# Patient Record
Sex: Male | Born: 1995
Health system: Southern US, Community
[De-identification: ages and names within clinical notes are randomized; demographics above are authoritative.]

## PROBLEM LIST (undated history)

## (undated) DIAGNOSIS — T7840XA Allergy, unspecified, initial encounter: Secondary | ICD-10-CM

## (undated) DIAGNOSIS — R51 Headache: Secondary | ICD-10-CM

## (undated) DIAGNOSIS — F913 Oppositional defiant disorder: Secondary | ICD-10-CM

## (undated) DIAGNOSIS — F419 Anxiety disorder, unspecified: Secondary | ICD-10-CM

## (undated) DIAGNOSIS — R519 Headache, unspecified: Secondary | ICD-10-CM

## (undated) DIAGNOSIS — F840 Autistic disorder: Secondary | ICD-10-CM

## (undated) DIAGNOSIS — F429 Obsessive-compulsive disorder, unspecified: Secondary | ICD-10-CM

## (undated) DIAGNOSIS — F909 Attention-deficit hyperactivity disorder, unspecified type: Secondary | ICD-10-CM

## (undated) HISTORY — PX: TYMPANOSTOMY TUBE PLACEMENT: SHX32

---
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
## 2017-02-26 DIAGNOSIS — M546 Pain in thoracic spine: Secondary | ICD-10-CM | POA: Diagnosis not present

## 2017-04-21 DIAGNOSIS — Z23 Encounter for immunization: Secondary | ICD-10-CM | POA: Diagnosis not present

## 2018-01-13 DIAGNOSIS — Z0000 Encounter for general adult medical examination without abnormal findings: Secondary | ICD-10-CM | POA: Diagnosis not present

## 2018-01-13 DIAGNOSIS — R7309 Other abnormal glucose: Secondary | ICD-10-CM | POA: Diagnosis not present

## 2018-01-14 DIAGNOSIS — Z0000 Encounter for general adult medical examination without abnormal findings: Secondary | ICD-10-CM | POA: Diagnosis not present

## 2018-01-17 DIAGNOSIS — Z1389 Encounter for screening for other disorder: Secondary | ICD-10-CM | POA: Diagnosis not present

## 2018-01-17 DIAGNOSIS — Z87898 Personal history of other specified conditions: Secondary | ICD-10-CM | POA: Diagnosis not present

## 2018-01-17 DIAGNOSIS — Z0000 Encounter for general adult medical examination without abnormal findings: Secondary | ICD-10-CM | POA: Diagnosis not present

## 2019-09-11 ENCOUNTER — Ambulatory Visit: Payer: 59 | Attending: Internal Medicine

## 2019-09-11 DIAGNOSIS — Z23 Encounter for immunization: Secondary | ICD-10-CM

## 2019-09-11 NOTE — Progress Notes (Signed)
   Covid-19 Vaccination Clinic  Name:  [name]    MRN: [id number] DOB: 1995/06/21  09/11/2019  [name] was observed post Covid-19 immunization for 15 minutes without incident. He was provided with Vaccine Information Sheet and instruction to access the V-Safe system.   [name] was instructed to call 911 with any severe reactions post vaccine: Marland Kitchen Difficulty breathing  . Swelling of face and throat  . A fast heartbeat  . A bad rash all over body  . Dizziness and weakness   Immunizations Administered    Name Date Dose VIS Date Route   Pfizer COVID-19 Vaccine 09/11/2019  4:13 PM 0.3 mL 05/29/2019 Intramuscular   Manufacturer: [employer], [employer]   Lot: [id number]   NDC: [address]-4

## 2019-10-07 ENCOUNTER — Ambulatory Visit: Payer: 59 | Attending: Internal Medicine

## 2019-10-07 DIAGNOSIS — Z23 Encounter for immunization: Secondary | ICD-10-CM

## 2019-10-07 NOTE — Progress Notes (Signed)
   Covid-19 Vaccination Clinic  Name:  [name]    MRN: [id number] DOB: 03/17/1996  10/07/2019  [name] was observed post Covid-19 immunization for 15 minutes without incident. He was provided with Vaccine Information Sheet and instruction to access the V-Safe system.   [name] was instructed to call 911 with any severe reactions post vaccine: Marland Kitchen Difficulty breathing  . Swelling of face and throat  . A fast heartbeat  . A bad rash all over body  . Dizziness and weakness   Immunizations Administered    Name Date Dose VIS Date Route   Pfizer COVID-19 Vaccine 10/07/2019  4:48 PM 0.3 mL 08/12/2018 Intramuscular   Manufacturer: [employer], [employer]   Lot: [id number]   NDC: [address]-2

## 2020-06-25 ENCOUNTER — Ambulatory Visit: Payer: 59 | Attending: Internal Medicine

## 2020-06-25 DIAGNOSIS — Z23 Encounter for immunization: Secondary | ICD-10-CM

## 2020-06-25 NOTE — Progress Notes (Signed)
   Covid-19 Vaccination Clinic  Name:  [name]    MRN: [id number] DOB: 10/19/1995  06/25/2020  Mr. Gut was observed post Covid-19 immunization for 15 minutes without incident. He was provided with Vaccine Information Sheet and instruction to access the V-Safe system.   [name] was instructed to call 911 with any severe reactions post vaccine: Marland Kitchen Difficulty breathing  . Swelling of face and throat  . A fast heartbeat  . A bad rash all over body  . Dizziness and weakness   Immunizations Administered    Name Date Dose VIS Date Route   Pfizer COVID-19 Vaccine 06/25/2020 12:18 PM 0.3 mL 04/06/2020 Intramuscular   Manufacturer: [employer], [employer]   Lot: [id number]   NDC: [address]-1

## 2020-12-02 ENCOUNTER — Other Ambulatory Visit: Payer: Self-pay

## 2020-12-02 ENCOUNTER — Other Ambulatory Visit (HOSPITAL_BASED_OUTPATIENT_CLINIC_OR_DEPARTMENT_OTHER): Payer: Self-pay

## 2020-12-02 ENCOUNTER — Ambulatory Visit: Payer: Self-pay | Attending: Internal Medicine

## 2020-12-02 DIAGNOSIS — Z23 Encounter for immunization: Secondary | ICD-10-CM

## 2020-12-02 MED ORDER — PFIZER-BIONT COVID-19 VAC-TRIS 30 MCG/0.3ML IM SUSP
INTRAMUSCULAR | 0 refills | Status: AC
Start: 2020-12-02 — End: ?
  Filled 2020-12-02: qty 0.3, 1d supply, fill #0

## 2020-12-02 NOTE — Progress Notes (Signed)
   Covid-19 Vaccination [facility]  Name:  [name]    MRN: [id number] DOB: [birth year]  12/02/2020  [name] was observed post Covid-19 immunization for 15 minutes without incident. He was provided with Vaccine Information Sheet and instruction to access the V-Safe system.   [name] was instructed to call 911 with any severe reactions post vaccine: Difficulty breathing  Swelling of face and throat  A fast heartbeat  A bad rash all over body  Dizziness and weakness   Immunizations Administered     Name Date Dose VIS Date Route   PFIZER Comrnaty(Gray TOP) Covid-19 Vaccine 12/02/2020  9:41 AM 0.3 mL 05/26/2020 Intramuscular   Manufacturer: [employer], [employer]   Lot: [id number]   NDC: [phone]

## 2021-06-07 ENCOUNTER — Other Ambulatory Visit (HOSPITAL_BASED_OUTPATIENT_CLINIC_OR_DEPARTMENT_OTHER): Payer: Self-pay

## 2021-06-07 ENCOUNTER — Ambulatory Visit: Payer: 59 | Attending: Internal Medicine

## 2021-06-07 DIAGNOSIS — Z23 Encounter for immunization: Secondary | ICD-10-CM

## 2021-06-07 MED ORDER — PFIZER COVID-19 VAC BIVALENT 30 MCG/0.3ML IM SUSP
INTRAMUSCULAR | 0 refills | Status: AC
  Filled 2021-06-07: qty 0.3, 1d supply, fill #0

## 2021-06-07 NOTE — Progress Notes (Signed)
° °  Covid-19 Vaccination Clinic  Name:  ZALMAN HULL    MRN: [id number] DOB: 1995/11/15  06/07/2021  Mr. Pilz was observed post Covid-19 immunization for 15 minutes without incident. He was provided with Vaccine Information Sheet and instruction to access the V-Safe system.   Mr. Cheramie was instructed to call 911 with any severe reactions post vaccine: Difficulty breathing  Swelling of face and throat  A fast heartbeat  A bad rash all over body  Dizziness and weakness   Immunizations Administered     Name Date Dose VIS Date Route   Pfizer Covid-19 Vaccine Bivalent Booster 06/07/2021  9:49 AM 0.3 mL 02/15/2021 Intramuscular   Manufacturer: [employer], [employer]   Lot: [id number]   NDC: [phone]
